# Patient Record
Sex: Male | Born: 1960 | Race: White | Hispanic: No | Marital: Single
Health system: Southern US, Community
[De-identification: ages and names within clinical notes are randomized; demographics above are authoritative.]

## PROBLEM LIST (undated history)

## (undated) DIAGNOSIS — F1010 Alcohol abuse, uncomplicated: Secondary | ICD-10-CM

## (undated) DIAGNOSIS — F419 Anxiety disorder, unspecified: Secondary | ICD-10-CM

## (undated) DIAGNOSIS — T1491XA Suicide attempt, initial encounter: Secondary | ICD-10-CM

---

## 2017-09-09 ENCOUNTER — Emergency Department (HOSPITAL_COMMUNITY): Payer: Self-pay

## 2017-09-09 ENCOUNTER — Emergency Department (HOSPITAL_COMMUNITY)
Admission: EM | Admit: 2017-09-09 | Discharge: 2017-09-10 | Disposition: A | Discharge status: Home / Self Care | Payer: Self-pay | Attending: Emergency Medicine | Admitting: Emergency Medicine

## 2017-09-09 DIAGNOSIS — R079 Chest pain, unspecified: Secondary | ICD-10-CM | POA: Insufficient documentation

## 2017-09-09 DIAGNOSIS — F1010 Alcohol abuse, uncomplicated: Secondary | ICD-10-CM | POA: Insufficient documentation

## 2017-09-09 DIAGNOSIS — S02401A Maxillary fracture, unspecified, initial encounter for closed fracture: Secondary | ICD-10-CM | POA: Insufficient documentation

## 2017-09-09 DIAGNOSIS — R040 Epistaxis: Secondary | ICD-10-CM | POA: Insufficient documentation

## 2017-09-09 DIAGNOSIS — S022XXA Fracture of nasal bones, initial encounter for closed fracture: Secondary | ICD-10-CM | POA: Insufficient documentation

## 2017-09-09 DIAGNOSIS — Y929 Unspecified place or not applicable: Secondary | ICD-10-CM | POA: Insufficient documentation

## 2017-09-09 DIAGNOSIS — Y9389 Activity, other specified: Secondary | ICD-10-CM | POA: Insufficient documentation

## 2017-09-09 DIAGNOSIS — Y998 Other external cause status: Secondary | ICD-10-CM | POA: Insufficient documentation

## 2017-09-09 MED ORDER — SODIUM CHLORIDE 0.9 % IV BOLUS (SEPSIS)
1000.0000 mL | Freq: Once | INTRAVENOUS | Status: AC
  Administered 2017-09-09: 1000 mL via INTRAVENOUS
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
## 2017-09-10 MED ORDER — ACETAMINOPHEN 500 MG PO TABS: 500.0000 mg | ORAL_TABLET | Freq: Four times a day (QID) | ORAL | 0 refills | Status: AC | PRN

## 2017-09-10 MED ORDER — LORAZEPAM 1 MG PO TABS
1.0000 mg | ORAL_TABLET | Freq: Once | ORAL | Status: AC
  Administered 2017-09-10: 1 mg via ORAL
  Filled 2017-09-10: qty 1

## 2017-09-10 NOTE — ED Notes (Addendum)
Able to contact pt.'s son,Joshua Wells, and notified that pt. Is being discharged and will be waiting for his ride at the lobby.

## 2017-09-10 NOTE — Discharge Instructions (Signed)
Your CT today showed a fracture to your maxillary sinus as well as to your nasal bone.  These fractures will heal without any intervention.  We recommend that you take Tylenol as needed for pain.  Apply ice to areas of pain to limit swelling.  Follow-up with a primary care doctor to ensure improvement in your symptoms.  You may return to the emergency department, as needed, for new or concerning symptoms.

## 2017-09-10 NOTE — ED Notes (Signed)
Pt ambulated without assistance and steady gait to the restroom.

## 2017-09-13 ENCOUNTER — Emergency Department (HOSPITAL_COMMUNITY)
Admission: EM | Admit: 2017-09-13 | Discharge: 2017-09-15 | Disposition: A | Discharge status: Home / Self Care | Payer: Self-pay | Attending: Emergency Medicine | Admitting: Emergency Medicine

## 2017-09-13 DIAGNOSIS — F10929 Alcohol use, unspecified with intoxication, unspecified: Secondary | ICD-10-CM

## 2017-09-13 DIAGNOSIS — F1014 Alcohol abuse with alcohol-induced mood disorder: Secondary | ICD-10-CM | POA: Diagnosis present

## 2017-09-13 DIAGNOSIS — F329 Major depressive disorder, single episode, unspecified: Secondary | ICD-10-CM | POA: Insufficient documentation

## 2017-09-13 DIAGNOSIS — R45851 Suicidal ideations: Secondary | ICD-10-CM | POA: Insufficient documentation

## 2017-09-13 DIAGNOSIS — F10129 Alcohol abuse with intoxication, unspecified: Secondary | ICD-10-CM | POA: Insufficient documentation

## 2017-09-13 LAB — CBC WITH DIFFERENTIAL/PLATELET
BASOS ABS: 0 10*3/uL (ref 0.0–0.1)
Basophils Relative: 0 %
Eosinophils Absolute: 0.1 10*3/uL (ref 0.0–0.7)
Eosinophils Relative: 1 %
HEMATOCRIT: 41.7 % (ref 39.0–52.0)
HEMOGLOBIN: 14 g/dL (ref 13.0–17.0)
LYMPHS ABS: 1.6 10*3/uL (ref 0.7–4.0)
LYMPHS PCT: 16 %
MCH: 32.1 pg (ref 26.0–34.0)
MCHC: 33.6 g/dL (ref 30.0–36.0)
MCV: 95.6 fL (ref 78.0–100.0)
Monocytes Absolute: 0.3 10*3/uL (ref 0.1–1.0)
Monocytes Relative: 3 %
NEUTROS PCT: 80 %
Neutro Abs: 8 10*3/uL — ABNORMAL HIGH (ref 1.7–7.7)
Platelets: 140 10*3/uL — ABNORMAL LOW (ref 150–400)
RBC: 4.36 MIL/uL (ref 4.22–5.81)
RDW: 14.2 % (ref 11.5–15.5)
WBC: 10 10*3/uL (ref 4.0–10.5)

## 2017-09-13 LAB — COMPREHENSIVE METABOLIC PANEL
ALBUMIN: 4.1 g/dL (ref 3.5–5.0)
ALK PHOS: 121 U/L (ref 38–126)
ALT: 52 U/L (ref 17–63)
ANION GAP: 13 (ref 5–15)
AST: 53 U/L — AB (ref 15–41)
BUN: 15 mg/dL (ref 6–20)
CALCIUM: 9 mg/dL (ref 8.9–10.3)
CO2: 22 mmol/L (ref 22–32)
Chloride: 108 mmol/L (ref 101–111)
Creatinine, Ser: 0.83 mg/dL (ref 0.61–1.24)
GFR calc Af Amer: >60 mL/min (ref >60)
GLUCOSE: 90 mg/dL (ref 65–99)
POTASSIUM: 4 mmol/L (ref 3.5–5.1)
Sodium: 143 mmol/L (ref 135–145)
TOTAL PROTEIN: 7.8 g/dL (ref 6.5–8.1)
Total Bilirubin: 0.2 mg/dL — ABNORMAL LOW (ref 0.3–1.2)

## 2017-09-13 LAB — RAPID URINE DRUG SCREEN, HOSP PERFORMED
Amphetamines: NOT DETECTED
BARBITURATES: NOT DETECTED
BENZODIAZEPINES: POSITIVE — AB
Cocaine: NOT DETECTED
Opiates: NOT DETECTED
Tetrahydrocannabinol: NOT DETECTED

## 2017-09-13 LAB — SALICYLATE LEVEL

## 2017-09-13 LAB — ETHANOL: ALCOHOL ETHYL (B): 198 mg/dL — AB (ref <10)

## 2017-09-13 LAB — ACETAMINOPHEN LEVEL

## 2017-09-13 MED ORDER — LORAZEPAM 1 MG PO TABS: 0.0000 mg | ORAL_TABLET | Freq: Two times a day (BID) | ORAL | Status: DC

## 2017-09-13 MED ORDER — LORAZEPAM 1 MG PO TABS
0.0000 mg | ORAL_TABLET | Freq: Four times a day (QID) | ORAL | Status: DC
  Administered 2017-09-13: 1 mg via ORAL
  Administered 2017-09-14: 2 mg via ORAL
  Filled 2017-09-13: qty 2
  Filled 2017-09-13: qty 1

## 2017-09-13 MED ORDER — VITAMIN B-1 100 MG PO TABS
100.0000 mg | ORAL_TABLET | Freq: Every day | ORAL | Status: DC
  Administered 2017-09-13 – 2017-09-15 (×3): 100 mg via ORAL
  Filled 2017-09-13 (×3): qty 1

## 2017-09-13 MED ORDER — LORAZEPAM 2 MG/ML IJ SOLN: 0.0000 mg | Freq: Four times a day (QID) | INTRAMUSCULAR | Status: DC

## 2017-09-13 MED ORDER — LORAZEPAM 2 MG/ML IJ SOLN: 0.0000 mg | Freq: Two times a day (BID) | INTRAMUSCULAR | Status: DC

## 2017-09-13 MED ORDER — THIAMINE HCL 100 MG/ML IJ SOLN: 100.0000 mg | Freq: Every day | INTRAMUSCULAR | Status: DC

## 2017-09-13 NOTE — ED Provider Notes (Signed)
[facility]-EMERGENCY DEPT Provider Note   CSN: [id number] Arrival date & time: 09/13/17  1638     History   Chief Complaint Chief Complaint  Patient presents with  . Suicidal  . IVC    HPI Derrick Wagner is a 56 y.o. male.  HPI Pt presents with suicidal ideation.  Pt has been depressed about his wife who died 2 years ago.  Pt also is upset about his family who says that he is worthless.  He says they would be better off if he dead.  Pt admits to drinking alcohol today.  He has been thinking that he would be better off dead.  He has been thinking about hanging himself like other family members did. He is a daily alcohol drinker.  He does not work right now. No past medical history on file.  There are no active problems to display for this patient.   No past surgical history on file.     Home Medications    Prior to Admission medications   Medication Sig Start Date End Date Taking? Authorizing Provider  acetaminophen (TYLENOL) 500 MG tablet Take 1 tablet (500 mg total) by mouth every 6 (six) hours as needed for mild pain or moderate pain. 09/10/17   Antony Madura, PA-C    Family History No family history on file.  Social History Social History  Substance Use Topics  . Smoking status: Not on file  . Smokeless tobacco: Not on file  . Alcohol use Not on file     Allergies   Patient has no known allergies.   Review of Systems Review of Systems  All other systems reviewed and are negative.    Physical Exam Updated Vital Signs BP 118/69 (BP Location: Right Arm)   Pulse 98   Temp 98.7 F (37.1 C) (Oral)   Resp 20   SpO2 99%   Physical Exam  Constitutional: He appears well-developed and well-nourished. No distress.  HENT:  Head: Normocephalic and atraumatic. Head is without raccoon's eyes and without Battle's sign.  Right Ear: External ear normal.  Left Ear: External ear normal.  Eyes: Lids are normal. Right eye exhibits no  discharge. Left eye exhibits chemosis. Right conjunctiva has no hemorrhage. Left conjunctiva has no hemorrhage.  Neck: No spinous process tenderness present. No tracheal deviation and no edema present.  Cardiovascular: Normal rate, regular rhythm and normal heart sounds.   Pulmonary/Chest: Effort normal and breath sounds normal. No stridor. No respiratory distress. He exhibits no tenderness, no crepitus and no deformity.  Abdominal: Soft. Normal appearance and bowel sounds are normal. He exhibits no distension and no mass. There is no tenderness.  Negative for seat belt sign  Musculoskeletal:       Cervical back: He exhibits no tenderness, no swelling and no deformity.       Thoracic back: He exhibits no tenderness, no swelling and no deformity.       Lumbar back: He exhibits no tenderness and no swelling.  Pelvis stable, no ttp  Neurological: He is alert. He has normal strength. No sensory deficit. He exhibits normal muscle tone. GCS eye subscore is 4. GCS verbal subscore is 5. GCS motor subscore is 6.  Able to move all extremities, sensation intact throughout  Skin: He is not diaphoretic.  Psychiatric: His speech is not tangential and not slurred. He is slowed. He exhibits a depressed mood. He expresses suicidal ideation. He expresses suicidal plans.  Nursing note and vitals reviewed.  ED Treatments / Results  Labs (all labs ordered are listed, but only abnormal results are displayed) Labs Reviewed  COMPREHENSIVE METABOLIC PANEL - Abnormal; Notable for the following:       Result Value   AST 53 (*)    Total Bilirubin 0.2 (*)    All other components within normal limits  ETHANOL - Abnormal; Notable for the following:    Alcohol, Ethyl (B) 198 (*)    All other components within normal limits  RAPID URINE DRUG SCREEN, HOSP PERFORMED - Abnormal; Notable for the following:    Benzodiazepines POSITIVE (*)    All other components within normal limits  CBC WITH DIFFERENTIAL/PLATELET -  Abnormal; Notable for the following:    Platelets 140 (*)    Neutro Abs 8.0 (*)    All other components within normal limits  ACETAMINOPHEN LEVEL - Abnormal; Notable for the following:    Acetaminophen (Tylenol), Serum <10 (*)    All other components within normal limits  SALICYLATE LEVEL     Procedures Procedures (including critical care time)  Medications Ordered in ED Medications  LORazepam (ATIVAN) injection 0-4 mg (not administered)    Or  LORazepam (ATIVAN) tablet 0-4 mg (not administered)  LORazepam (ATIVAN) injection 0-4 mg (not administered)    Or  LORazepam (ATIVAN) tablet 0-4 mg (not administered)  thiamine (VITAMIN B-1) tablet 100 mg (not administered)    Or  thiamine (B-1) injection 100 mg (not administered)     Initial Impression / Assessment and Plan / ED Course  I have reviewed the triage vital signs and the nursing notes.  Pertinent labs & imaging results that were available during my care of the patient were reviewed by me and considered in my medical decision making (see chart for details).   Pt admits to depression and suicidal ideation. He has asked to leave.  I will place him on IVC.  I explained to him that I do not want to leave and harm himself.  I will not let him leave the ED at this time.   Labs reviewed. Alcohol intoxications noted.  Medically cleared for psych evaluation.   Final Clinical Impressions(s) / ED Diagnoses   Final diagnoses:  Alcoholic intoxication with complication (HCC)  Suicidal ideation       Linwood DibblesKnapp, Rache Klimaszewski, MD 09/13/17 [phone]

## 2017-09-13 NOTE — ED Notes (Signed)
Patient came to desk tearful stating "that was a good last meal to have".

## 2017-09-13 NOTE — ED Notes (Signed)
Pt A&O x 3, no distress noted, calm & cooperative, watching TV at present.  Monitoring for safety, Q 15 min checks in effect. 

## 2017-09-13 NOTE — BH Assessment (Signed)
[facility]  Nira ConnJason Berry, NP recommends inpt treatment. [facility]. Pt's nurse Ronnell FreshwaterLondon, Latricia L, RN has been notified of recommendation. EDP Dr. Fredderick PhenixBelfi, MD has been notified of disposition.   Princess BruinsAquicha Shakeena Kafer, MSW, LCSW Therapeutic Triage Specialist  [phone]

## 2017-09-13 NOTE — ED Notes (Signed)
Pt oriented to room and unit.  Pt is calm and cooperative at this time.  He asked for ginger ale and snacks to settle his stomach.  This patient is insistent that he is going to kill himself when he leaves this hospital.  He does however promise that he will not do anything while here.  Pt is very flat,  He his pleasant . 15 minute checks and video monitoring in place.
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
## 2017-09-13 NOTE — ED Triage Notes (Signed)
Pt presents to the ED tearful and anxious. Pt reports that "Nobody cares if he lives or dies and his family wants him dead" pt reports they "cursed my conception." Pt reports he wants to kill himself because "Death brings peace." Pt states he "will hang myself" or "Find peace at the bottom of a bottle on a shelf"  Pt then became anxious and attempted to leave triage. [facility] [place] [facility] [place] [facility]  Pt reported to the MD that he drinks daily and just does not want to live

## 2017-09-14 DIAGNOSIS — F332 Major depressive disorder, recurrent severe without psychotic features: Secondary | ICD-10-CM

## 2017-09-14 DIAGNOSIS — F1014 Alcohol abuse with alcohol-induced mood disorder: Secondary | ICD-10-CM

## 2017-09-14 DIAGNOSIS — F1910 Other psychoactive substance abuse, uncomplicated: Secondary | ICD-10-CM

## 2017-09-14 MED ORDER — GABAPENTIN 300 MG PO CAPS
300.0000 mg | ORAL_CAPSULE | Freq: Three times a day (TID) | ORAL | Status: DC
  Administered 2017-09-14 – 2017-09-15 (×3): 300 mg via ORAL
  Filled 2017-09-14 (×3): qty 1

## 2017-09-14 MED ORDER — HYDROXYZINE HCL 25 MG PO TABS
50.0000 mg | ORAL_TABLET | Freq: Once | ORAL | Status: AC
  Administered 2017-09-14: 50 mg via ORAL
  Filled 2017-09-14: qty 2

## 2017-09-14 MED ORDER — CITALOPRAM HYDROBROMIDE 10 MG PO TABS
10.0000 mg | ORAL_TABLET | Freq: Every day | ORAL | Status: DC
  Administered 2017-09-14 – 2017-09-15 (×2): 10 mg via ORAL
  Filled 2017-09-14 (×2): qty 1
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
## 2017-09-14 NOTE — ED Notes (Signed)
Hourly rounding reveals patient in room. No complaints, stable, in no acute distress. Q15 minute rounds and monitoring via Security Cameras to continue. 

## 2017-09-14 NOTE — ED Notes (Signed)
Hourly rounding reveals patient in room. Expressing concern about insomnia, stable, in no acute distress. Q15 minute rounds and monitoring via [employer] Cam[employer] to continue.

## 2017-09-14 NOTE — Patient Outreach (Signed)
ED Peer Support Specialist Patient Intake (Complete at intake & 30-60 Day Follow-up)  Name: Derrick Wagner  MRN: [id number]  Age: 56 y.o.   Date of Admission: 09/14/2017  Intake: Initial Comments:      Primary Reason Admitted: alcohol use, SI  Lab values: Alcohol/ETOH: Positive Positive UDS? Yes Amphetamines: No Barbiturates: No Benzodiazepines: Yes Cocaine: No Opiates: No Cannabinoids: No  Demographic information: Gender: Male Ethnicity: White Marital Status: Widowed (Windowed and Divorced) Insurance Status: Uninsured/Self-pay Control and instrumentation engineereceives non-medical governmental assistance (Work Engineer, agriculturalirst/Welfare, Sales executivefood stamps, etc.: No Lives with: Alone Living situation: Homeless  Reported Patient History: Patient reported health conditions: Depression (Anxiety) Patient aware of HIV and hepatitis status: Yes (comment) (Negative)  In past year, has patient visited ED for any reason? No  Number of ED visits:    Reason(s) for visit:    In past year, has patient been hospitalized for any reason? Yes  Number of hospitalizations: 1  Reason(s) for hospitalization: [facility]  In past year, has patient been arrested? Yes  Number of arrests: 1  Reason(s) for arrest: bench warrant, 54 year old warrant  In past year, has patient been incarcerated? No (bench warrant dismissed)  Number of incarcerations:    Reason(s) for incarceration:    In past year, has patient received medication-assisted treatment? No  In past year, patient received the following treatments:    In past year, has patient received any harm reduction services? No  Did this include any of the following?    In past year, has patient received care from a mental health provider for diagnosis other than SUD? No  In past year, is this first time patient has overdosed? Yes (has not overdosed this year)  Number of past overdoses:    In past year, is this first time patient has been hospitalized for an overdose?   (Has not overdosed)  Number of hospitalizations for overdose(s):    Is patient currently receiving treatment for a mental health diagnosis? No  Patient reports experiencing difficulty participating in SUD treatment: No    Most important reason(s) for this difficulty?    Has patient received prior services for treatment? Yes ([facility] for [facility] treatment)  In past, patient has received services from following agencies:    Plan of Care:  Suggested follow up at these agencies/treatment centers:  (Patient is interested inpatient substance use treatment services. Plan to follow up with [facility], [facility], and [facility] for inpatient substance use treatment.)  Other information:    Derrick BoardsJohn Lorin Wagner, CPSS  09/14/2017 12:25 PM

## 2017-09-14 NOTE — ED Notes (Signed)
Pt reports feeling unsupported by his family because is son's wife is tired of him being a drain on them. He does not want to create problems for them. He reports feeling stressed and has asked for medication for that. Pt given gabapentin per orders.

## 2017-09-14 NOTE — ED Notes (Signed)
Hourly rounding reveals patient sleeping in room. No complaints, stable, in no acute distress. Q15 minute rounds and monitoring via Security Cameras to continue. 

## 2017-09-14 NOTE — ED Notes (Signed)
Report to include Situation, Background, Assessment, and Recommendations received from Diane RN. Patient alert and oriented, warm and dry, in no acute distress. Patient denies SI, HI, AVH and pain. Patient made aware of Q15 minute rounds and security cameras for their safety. Patient instructed to come to me with needs or concerns. 

## 2017-09-14 NOTE — Patient Outreach (Signed)
Patient will be picked up tomorrow sometime around 12:00 by his son. After the patient is discharged, the son will transport the [facility] [place] detox services.

## 2017-09-15 MED ORDER — CITALOPRAM HYDROBROMIDE 10 MG PO TABS
10.0000 mg | ORAL_TABLET | Freq: Every day | ORAL | 0 refills | Status: AC
Start: 2017-09-15 — End: ?

## 2017-09-15 MED ORDER — GABAPENTIN 300 MG PO CAPS: 300.0000 mg | ORAL_CAPSULE | Freq: Three times a day (TID) | ORAL | 0 refills | Status: AC

## 2017-09-15 NOTE — ED Notes (Signed)
Hourly rounding reveals patient sleeping in room. No complaints, stable, in no acute distress. Q15 minute rounds and monitoring via Security Cameras to continue. 

## 2017-09-15 NOTE — Consult Note (Signed)
[facility] Consult   Reason for Consult:  Alcohol intoxication with suicidal ideations  Referring Physician:  EDP Patient Identification: Derrick Wagner MRN:  [id number] Principal Diagnosis: Alcohol abuse with alcohol-induced mood disorder [facility]) Diagnosis:   Patient Active Problem List   Diagnosis Date Noted  . Alcohol abuse with alcohol-induced mood disorder ([place]) [F10.14] 09/14/2017    Priority: High    Total Time spent with patient: 30 minutes  Subjective:   Derrick Wagner is a 56 y.o. male patient has stabilized.  HPI:  56 yo male who came to the ED with alcohol intoxication with suicidal ideations.  He met with Peer Support yesterday who arranged for him to go to [facility] in [place] for substance abuse and his son is to pick him up at noon to take him.  Smiling on assessment and anxious to be ready for his son's arrival.  No suicidal/homicidal ideations, hallucinations, or withdrawal symptoms. Stable for discharge.  Past Psychiatric History: substance abuse  Risk to Self: Minimal to none. Risk to Others: Homicidal Ideation: No Thoughts of Harm to Others: No Current Homicidal Intent: No Current Homicidal Plan: No Access to Homicidal Means: No History of harm to others?: No Assessment of Violence: None Noted Does patient have access to weapons?: No Criminal Charges Pending?: No Does patient have a court date: No Prior Inpatient Therapy: Prior Inpatient Therapy: Yes Prior Therapy Dates: 2018 Prior Therapy Facilty/Provider(s): [facility] Reason for Treatment: Alcohol Dependence  Prior Outpatient Therapy: Prior Outpatient Therapy: No Does patient have an ACCT team?: No Does patient have Intensive In-House Services?  : No Does patient have [facility] services? : No Does patient have P4CC services?: No  Past Medical History: No past medical history on file. No past surgical history on file. Family History: No family history on file. Family Psychiatric  History:  substance abuse Social History:  History  Alcohol use Not on file     History  Drug use: Unknown    Social History   Social History  . Marital status: Single    Spouse name: N/A  . Number of children: N/A  . Years of education: N/A   Social History Main Topics  . Smoking status: Not on file  . Smokeless tobacco: Not on file  . Alcohol use Not on file  . Drug use: Unknown  . Sexual activity: Not on file   Other Topics Concern  . Not on file   Social History Narrative  . No narrative on file   Additional Social History:    Allergies:  No Known Allergies  Labs:  Results for orders placed or performed during the hospital encounter of 09/13/17 (from the past 48 hour(s))  Urine rapid drug screen (hosp performed)     Status: Abnormal   Collection Time: 09/13/17  4:59 PM  Result Value Ref Range   Opiates NONE DETECTED NONE DETECTED   Cocaine NONE DETECTED NONE DETECTED   Benzodiazepines POSITIVE (A) NONE DETECTED   Amphetamines NONE DETECTED NONE DETECTED   Tetrahydrocannabinol NONE DETECTED NONE DETECTED   Barbiturates NONE DETECTED NONE DETECTED    Comment:        DRUG SCREEN FOR MEDICAL PURPOSES ONLY.  IF CONFIRMATION IS NEEDED FOR ANY PURPOSE, NOTIFY LAB WITHIN 5 DAYS.        LOWEST DETECTABLE LIMITS FOR URINE DRUG SCREEN Drug Class       Cutoff (ng/mL) Amphetamine      1000 Barbiturate      200 Benzodiazepine  157 Tricyclics       262 Opiates          300 Cocaine          300 THC              50   Comprehensive metabolic panel     Status: Abnormal   Collection Time: 09/13/17  5:25 PM  Result Value Ref Range   Sodium 143 135 - 145 mmol/L   Potassium 4.0 3.5 - 5.1 mmol/L   Chloride 108 101 - 111 mmol/L   CO2 22 22 - 32 mmol/L   Glucose, Bld 90 65 - 99 mg/dL   BUN 15 6 - 20 mg/dL   Creatinine, Ser 0.83 0.61 - 1.24 mg/dL   Calcium 9.0 8.9 - 10.3 mg/dL   Total Protein 7.8 6.5 - 8.1 g/dL   Albumin 4.1 3.5 - 5.0 g/dL   AST 53 (H) 15 - 41 U/L   ALT 52  17 - 63 U/L   Alkaline Phosphatase 121 38 - 126 U/L   Total Bilirubin 0.2 (L) 0.3 - 1.2 mg/dL   GFR calc non Af Amer >60 >60 mL/min   GFR calc Af Amer >60 >60 mL/min    Comment: (NOTE) The eGFR has been calculated using the CKD EPI equation. This calculation has not been validated in all clinical situations. eGFR's persistently <60 mL/min signify possible Chronic Kidney Disease.    Anion gap 13 5 - 15  Ethanol     Status: Abnormal   Collection Time: 09/13/17  5:25 PM  Result Value Ref Range   Alcohol, Ethyl (B) 198 (H) <10 mg/dL    Comment:        LOWEST DETECTABLE LIMIT FOR SERUM ALCOHOL IS 10 mg/dL FOR MEDICAL PURPOSES ONLY   CBC with Diff     Status: Abnormal   Collection Time: 09/13/17  5:25 PM  Result Value Ref Range   WBC 10.0 4.0 - 10.5 K/uL   RBC 4.36 4.22 - 5.81 MIL/uL   Hemoglobin 14.0 13.0 - 17.0 g/dL   HCT 41.7 39.0 - 52.0 %   MCV 95.6 78.0 - 100.0 fL   MCH 32.1 26.0 - 34.0 pg   MCHC 33.6 30.0 - 36.0 g/dL   RDW 14.2 11.5 - 15.5 %   Platelets 140 (L) 150 - 400 K/uL   Neutrophils Relative % 80 %   Neutro Abs 8.0 (H) 1.7 - 7.7 K/uL   Lymphocytes Relative 16 %   Lymphs Abs 1.6 0.7 - 4.0 K/uL   Monocytes Relative 3 %   Monocytes Absolute 0.3 0.1 - 1.0 K/uL   Eosinophils Relative 1 %   Eosinophils Absolute 0.1 0.0 - 0.7 K/uL   Basophils Relative 0 %   Basophils Absolute 0.0 0.0 - 0.1 K/uL  Salicylate level     Status: None   Collection Time: 09/13/17  5:25 PM  Result Value Ref Range   Salicylate Lvl <0.3 2.8 - 30.0 mg/dL  Acetaminophen level     Status: Abnormal   Collection Time: 09/13/17  5:25 PM  Result Value Ref Range   Acetaminophen (Tylenol), Serum <10 (L) 10 - 30 ug/mL    Comment:        THERAPEUTIC CONCENTRATIONS VARY SIGNIFICANTLY. A RANGE OF 10-30 ug/mL MAY BE AN EFFECTIVE CONCENTRATION FOR MANY PATIENTS. HOWEVER, SOME ARE BEST TREATED AT CONCENTRATIONS OUTSIDE THIS RANGE. ACETAMINOPHEN CONCENTRATIONS >150 ug/mL AT 4 HOURS AFTER INGESTION  AND >50 ug/mL AT 12 HOURS AFTER INGESTION  ARE OFTEN ASSOCIATED WITH TOXIC REACTIONS.     Current Facility-Administered Medications  Medication Dose Route Frequency Provider Last Rate Last Dose  . citalopram (CELEXA) tablet 10 mg  10 mg Oral Daily Patrecia Pour, NP   10 mg at 09/15/17 1048  . gabapentin (NEURONTIN) capsule 300 mg  300 mg Oral TID Patrecia Pour, NP   300 mg at 09/15/17 1048  . thiamine (VITAMIN B-1) tablet 100 mg  100 mg Oral Daily Dorie Rank, MD   100 mg at 09/15/17 1049   Or  . thiamine (B-1) injection 100 mg  100 mg Intravenous Daily Dorie Rank, MD       Current Outpatient Prescriptions  Medication Sig Dispense Refill  . acetaminophen (TYLENOL) 500 MG tablet Take 1 tablet (500 mg total) by mouth every 6 (six) hours as needed for mild pain or moderate pain. 30 tablet 0  . citalopram (CELEXA) 10 MG tablet Take 1 tablet (10 mg total) by mouth daily. 30 tablet 0  . gabapentin (NEURONTIN) 300 MG capsule Take 1 capsule (300 mg total) by mouth 3 (three) times daily. 90 capsule 0    Musculoskeletal: Strength & Muscle Tone: within normal limits Gait & Station: normal Patient leans: N/A  Psychiatric Specialty Exam: Physical Exam  Constitutional: He is oriented to person, place, and time. He appears well-developed and well-nourished.  HENT:  Head: Normocephalic.  Neck: Normal range of motion.  Respiratory: Effort normal.  Musculoskeletal: Normal range of motion.  Neurological: He is alert and oriented to person, place, and time.  Psychiatric: His speech is normal and behavior is normal. Judgment and thought content normal. Cognition and memory are normal.    Review of Systems  Psychiatric/Behavioral: Positive for substance abuse.  All other systems reviewed and are negative.   Blood pressure 123/84, pulse 90, temperature 98.4 F (36.9 C), temperature source Oral, resp. rate 18, SpO2 100 %.There is no height or weight on file to calculate BMI.  General  Appearance: Casual  Eye Contact:  Good  Speech:  Normal Rate  Volume:  Normal  Mood:  Euthymic  Affect:  Congruent  Thought Process:  Coherent and Descriptions of Associations: Intact  Orientation:  Full (Time, Place, and Person)  Thought Content:  WDL and Logical  Suicidal Thoughts:  No  Homicidal Thoughts:  No  Memory:  Immediate, good; recent, good; remote, good  Judgement:  Fair  Insight:  Fair  Psychomotor Activity:  Normal  Concentration:  Concentration: Good and Attention Span: Good  Recall:  Good  Fund of Knowledge:  Fair  Language:  Good  Akathisia:  No  Handed:  Right  AIMS (if indicated):     Assets:  Leisure Time Physical Health Resilience Social Support  ADL's:  Intact  Cognition:  WNL  Sleep:        Treatment Plan Summary: Daily contact with patient to assess and evaluate symptoms and progress in treatment, Medication management and Plan alcohol abuse with alcohol induced mood disorder:  -Crisis stabilization -Medication management:  Continued Celexa 10 mg daily for depression and started gabapentin 300 mg TID for withdrawal symptoms -Rx provided -Individual and substance abuse counseling -Peer support referral  Disposition: Psychiatrically stable for discharge home  Waylan Boga, NP 09/15/2017 1:04 PM   Patient seen face-to-face for psychiatric evaluation, chart reviewed and case discussed with the physician extender and developed treatment plan. Reviewed the information documented and agree with the treatment plan.  Buford Dresser, DO   Patient seen face-to-face  for psychiatric evaluation, chart reviewed and case discussed with the physician extender and developed treatment plan. Reviewed the information documented and agree with the treatment plan.  Buford Dresser, DO

## 2017-09-15 NOTE — Progress Notes (Signed)
IVC has been rescinded.   Stacy GardnerErin Kalyn Dimattia, [facility] CenterCSWA Emergency Room Clinical Social Worker [phone]

## 2017-09-15 NOTE — [facility] Suicide Risk Assessment (Signed)
Suicide Risk Assessment  Discharge Assessment   [facility]   Principal Problem: Alcohol abuse with alcohol-induced mood disorder [facility](HCC) Discharge Diagnoses:  Patient Active Problem List   Diagnosis Date Noted  . Alcohol abuse with alcohol-induced mood disorder ([facility]) [F10.14] 09/14/2017    Priority: High    Total Time spent with patient: 30 minutes  Musculoskeletal: Strength & Muscle Tone: within normal limits Gait & Station: normal Patient leans: N/A  Psychiatric Specialty Exam: Physical Exam  Constitutional: He is oriented to person, place, and time. He appears well-developed and well-nourished.  HENT:  Head: Normocephalic.  Neck: Normal range of motion.  Respiratory: Effort normal.  Musculoskeletal: Normal range of motion.  Neurological: He is alert and oriented to person, place, and time.  Psychiatric: His speech is normal and behavior is normal. Judgment and thought content normal. Cognition and memory are normal. He exhibits a depressed mood.    Review of Systems  Psychiatric/Behavioral: Positive for depression and substance abuse.  All other systems reviewed and are negative.   Blood pressure 123/84, pulse 90, temperature 98.4 F (36.9 C), temperature source Oral, resp. rate 18, SpO2 100 %.There is no height or weight on file to calculate BMI.  General Appearance: Casual  Eye Contact:  Good  Speech:  Normal Rate  Volume:  Normal  Mood:  Euthymic  Affect:  Congruent  Thought Process:  Coherent and Descriptions of Associations: Intact  Orientation:  Full (Time, Place, and Person)  Thought Content:  WDL and Logical  Suicidal Thoughts:  Passive SI but no plan or intention to harm self.  Homicidal Thoughts:  No  Memory:  Immediate, good; recent, good; remote, good  Judgement:  Fair  Insight:  Fair  Psychomotor Activity:  Normal  Concentration:  Concentration: Good and Attention Span: Good  Recall:  Good  Fund of Knowledge:  Fair   Language:  Good  Akathisia:  No  Handed:  Right  AIMS (if indicated):     Assets:  Leisure Time Physical Health Resilience Social Support  ADL's:  Intact  Cognition:  WNL  Sleep:       Mental Status Per Nursing Assessment::   On Admission:   alcohol intoxication with suicidal ideations  Demographic Factors:  Male and Caucasian  Loss Factors: NA  Historical Factors: NA  Risk Reduction Factors:   Sense of responsibility to family and Positive social support  Continued Clinical Symptoms:  None   Cognitive Features That Contribute To Risk:  None    Suicide Risk:  Minimal: No identifiable suicidal ideation.  Patients presenting with no risk factors but with morbid ruminations; may be classified as minimal risk based on the severity of the depressive symptoms    Plan Of Care/Follow-up recommendations:  Activity:  as tolerated Diet:  heart healthy diet  Zolton Dowson, NP 09/15/2017, 1:09 PM

## 2017-09-15 NOTE — ED Notes (Signed)
Pt discharged home. Discharged instructions read to pt who verbalized understanding. All belongings returned to pt who signed for same. Denies SI/HI, is not delusional and not responding to internal stimuli. Escorted pt to the ED exit.    

## 2017-09-16 ENCOUNTER — Encounter (HOSPITAL_COMMUNITY): Payer: Self-pay | Admitting: Behavioral Health

## 2017-09-16 ENCOUNTER — Emergency Department (HOSPITAL_COMMUNITY)
Admission: EM | Admit: 2017-09-16 | Discharge: 2017-09-17 | Disposition: A | Discharge status: Other Acute Inpatient Hospital | Payer: Self-pay | Attending: Emergency Medicine | Admitting: Emergency Medicine

## 2017-09-16 DIAGNOSIS — Z79899 Other long term (current) drug therapy: Secondary | ICD-10-CM | POA: Insufficient documentation

## 2017-09-16 DIAGNOSIS — R45851 Suicidal ideations: Secondary | ICD-10-CM | POA: Insufficient documentation

## 2017-09-16 DIAGNOSIS — S51812A Laceration without foreign body of left forearm, initial encounter: Secondary | ICD-10-CM | POA: Insufficient documentation

## 2017-09-16 DIAGNOSIS — Y9389 Activity, other specified: Secondary | ICD-10-CM | POA: Insufficient documentation

## 2017-09-16 DIAGNOSIS — Y998 Other external cause status: Secondary | ICD-10-CM | POA: Insufficient documentation

## 2017-09-16 DIAGNOSIS — Y929 Unspecified place or not applicable: Secondary | ICD-10-CM | POA: Insufficient documentation

## 2017-09-16 DIAGNOSIS — X788XXA Intentional self-harm by other sharp object, initial encounter: Secondary | ICD-10-CM | POA: Insufficient documentation

## 2017-09-16 HISTORY — DX: Anxiety disorder, unspecified: F41.9

## 2017-09-16 HISTORY — DX: Suicide attempt, initial encounter: T14.91XA

## 2017-09-16 HISTORY — DX: Alcohol abuse, uncomplicated: F10.10

## 2017-09-16 LAB — CBC
HEMATOCRIT: 39.8 % (ref 39.0–52.0)
Hemoglobin: 13.2 g/dL (ref 13.0–17.0)
MCH: 31.7 pg (ref 26.0–34.0)
MCHC: 33.2 g/dL (ref 30.0–36.0)
MCV: 95.7 fL (ref 78.0–100.0)
PLATELETS: 158 10*3/uL (ref 150–400)
RBC: 4.16 MIL/uL — AB (ref 4.22–5.81)
RDW: 14.4 % (ref 11.5–15.5)
WBC: 6.5 10*3/uL (ref 4.0–10.5)

## 2017-09-16 LAB — COMPREHENSIVE METABOLIC PANEL
ALT: 41 U/L (ref 17–63)
AST: 40 U/L (ref 15–41)
Albumin: 3.7 g/dL (ref 3.5–5.0)
Alkaline Phosphatase: 97 U/L (ref 38–126)
Anion gap: 8 (ref 5–15)
BILIRUBIN TOTAL: 0.4 mg/dL (ref 0.3–1.2)
BUN: 12 mg/dL (ref 6–20)
CHLORIDE: 100 mmol/L — AB (ref 101–111)
CO2: 26 mmol/L (ref 22–32)
CREATININE: 0.9 mg/dL (ref 0.61–1.24)
Calcium: 8.8 mg/dL — ABNORMAL LOW (ref 8.9–10.3)
GFR calc Af Amer: >60 mL/min (ref >60)
Glucose, Bld: 252 mg/dL — ABNORMAL HIGH (ref 65–99)
Potassium: 3.7 mmol/L (ref 3.5–5.1)
Sodium: 134 mmol/L — ABNORMAL LOW (ref 135–145)
TOTAL PROTEIN: 7.1 g/dL (ref 6.5–8.1)

## 2017-09-16 LAB — SALICYLATE LEVEL: Salicylate Lvl: <7 mg/dL (ref 2.8–30.0)

## 2017-09-16 LAB — ETHANOL

## 2017-09-16 LAB — ACETAMINOPHEN LEVEL: Acetaminophen (Tylenol), Serum: <10 ug/mL — ABNORMAL LOW (ref 10–30)

## 2017-09-16 MED ORDER — VITAMIN B-1 100 MG PO TABS
100.0000 mg | ORAL_TABLET | Freq: Every day | ORAL | Status: DC
  Administered 2017-09-16 – 2017-09-17 (×2): 100 mg via ORAL
  Filled 2017-09-16 (×2): qty 1

## 2017-09-16 MED ORDER — LORAZEPAM 2 MG/ML IJ SOLN: 0.0000 mg | Freq: Two times a day (BID) | INTRAMUSCULAR | Status: DC

## 2017-09-16 MED ORDER — IBUPROFEN 200 MG PO TABS
600.0000 mg | ORAL_TABLET | Freq: Three times a day (TID) | ORAL | Status: DC | PRN
  Administered 2017-09-17: 600 mg via ORAL
  Filled 2017-09-16: qty 1

## 2017-09-16 MED ORDER — ALUM & MAG HYDROXIDE-SIMETH 200-200-20 MG/5ML PO SUSP: 30.0000 mL | Freq: Four times a day (QID) | ORAL | Status: DC | PRN

## 2017-09-16 MED ORDER — LORAZEPAM 1 MG PO TABS: 0.0000 mg | ORAL_TABLET | Freq: Two times a day (BID) | ORAL | Status: DC

## 2017-09-16 MED ORDER — LORAZEPAM 1 MG PO TABS
0.0000 mg | ORAL_TABLET | Freq: Four times a day (QID) | ORAL | Status: DC
  Administered 2017-09-16 – 2017-09-17 (×4): 1 mg via ORAL
  Filled 2017-09-16 (×4): qty 1

## 2017-09-16 MED ORDER — ONDANSETRON HCL 4 MG PO TABS: 4.0000 mg | ORAL_TABLET | Freq: Three times a day (TID) | ORAL | Status: DC | PRN

## 2017-09-16 MED ORDER — LIDOCAINE HCL 2 % IJ SOLN
20.0000 mL | Freq: Once | INTRAMUSCULAR | Status: AC
  Administered 2017-09-16: 7 mL
  Filled 2017-09-16: qty 20

## 2017-09-16 MED ORDER — LORAZEPAM 2 MG/ML IJ SOLN: 0.0000 mg | Freq: Four times a day (QID) | INTRAMUSCULAR | Status: DC

## 2017-09-16 MED ORDER — THIAMINE HCL 100 MG/ML IJ SOLN
100.0000 mg | Freq: Every day | INTRAMUSCULAR | Status: DC
Start: 2017-09-16 — End: 2017-09-17

## 2017-09-16 NOTE — ED Notes (Signed)
Pt. Wanded, sitter called, and charge nurse called to report.  Pt. Dressed in scrubs.
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
## 2017-09-16 NOTE — ED Notes (Signed)
Pt resting quietly in hallway, no complaints at this time

## 2017-09-16 NOTE — ED Notes (Signed)
Lunch tray ordered; regular diet 

## 2017-09-16 NOTE — [facility] Counselor (Signed)
Attempting assessment.  Awaiting call from Pt's nurse.
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
## 2017-09-16 NOTE — ED Notes (Signed)
Pt voicing that depression and thoughts of suicide have bothered him for about 6 wks now; has a family and friends that are supportive however having trouble getting courage to tell them "I want to kill myself"; pt cooperative and calm at this time, willing to participate in any treatment plans; currently getting L forearm sutured by PA student
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
## 2017-09-16 NOTE — ED Notes (Signed)
Pt. Didn't eat any of his lunch tray. Pt. Said he wasn't hungry and he couldn't eat

## 2017-09-17 ENCOUNTER — Encounter (HOSPITAL_COMMUNITY): Payer: Self-pay | Admitting: *Deleted

## 2017-09-17 LAB — RAPID URINE DRUG SCREEN, HOSP PERFORMED
AMPHETAMINES: NOT DETECTED
BENZODIAZEPINES: POSITIVE — AB
Barbiturates: NOT DETECTED
COCAINE: NOT DETECTED
OPIATES: NOT DETECTED
TETRAHYDROCANNABINOL: NOT DETECTED

## 2017-09-17 NOTE — Progress Notes (Signed)
Patient meets criteria for inpatient treatment. CSW faxed referrals to the following inpatient facilities for review:  [facility], [facility], [facility], [facility], [facility], [facility], [facility], [facility], [facility]   TTS will continue to seek bed placement.   Baldo DaubJolan Kawhi Diebold MSW, LCSWA CSW Disposition [phone]

## 2017-09-17 NOTE — ED Notes (Signed)
PT requesting anxiety meds.  States he normally takes xanax that he buys from a friend, 1 mg 3 x's per day.

## 2017-09-17 NOTE — Progress Notes (Signed)
Accepted to [facility].    Accepting/attending physician is Dr. Estill Cottahomas Cornwall. [facility], [facility].  Number for report is [phone] Patient can arrive at anytime, the bed is ready.  Lucky RathkeBonnie Shanas, RN notified and agreed to inform the patient's nurse, Trinda PascalBrenda Michelson, RN.    Baldo DaubJolan Ziana Heyliger MSW, LCSWA CSW Disposition [phone]

## 2017-09-17 NOTE — ED Notes (Signed)
Lunch tray ordered 

## 2017-09-17 NOTE — ED Notes (Signed)
Patient using phone for phone call x 1 this morning.

## 2017-09-17 NOTE — BH Assessment (Signed)
Per Laverda PageKinnari Patel, Dr Estill Cottahomas Cornwall [facility], [facility], [address], pt can arrive at any time. Call report to [phone]. Notified [facility].

## 2017-09-17 NOTE — ED Notes (Signed)
Pt arm dressed with non-adherant guaze

## 2017-09-25 ENCOUNTER — Ambulatory Visit (HOSPITAL_COMMUNITY): Payer: Self-pay

## 2017-11-08 ENCOUNTER — Telehealth (HOSPITAL_COMMUNITY): Payer: Self-pay
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
## 2021-11-13 DEATH — deceased
